# Patient Record
Sex: Female | Born: 1990 | Race: White | Hispanic: No | Marital: Single | Smoking: Never smoker
Health system: Southern US, Community
[De-identification: ages and names within clinical notes are randomized; demographics above are authoritative.]

## PROBLEM LIST (undated history)

## (undated) DIAGNOSIS — K219 Gastro-esophageal reflux disease without esophagitis: Secondary | ICD-10-CM

## (undated) DIAGNOSIS — IMO0002 Reserved for concepts with insufficient information to code with codable children: Secondary | ICD-10-CM

## (undated) HISTORY — DX: Gastro-esophageal reflux disease without esophagitis: K21.9

## (undated) HISTORY — DX: Reserved for concepts with insufficient information to code with codable children: IMO0002

---
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
## 2012-01-29 ENCOUNTER — Emergency Department: Payer: Self-pay | Admitting: Emergency Medicine

## 2012-03-17 ENCOUNTER — Ambulatory Visit: Payer: Self-pay | Admitting: Pediatrics

## 2012-03-21 ENCOUNTER — Emergency Department: Payer: Self-pay | Admitting: Emergency Medicine

## 2012-03-21 LAB — CBC
HGB: 11.3 g/dL — ABNORMAL LOW (ref 12.0–16.0)
Platelet: 249 10*3/uL (ref 150–440)
RBC: 4.76 10*6/uL (ref 3.80–5.20)
RDW: 16.6 % — ABNORMAL HIGH (ref 11.5–14.5)

## 2012-03-25 LAB — WOUND CULTURE

## 2012-03-27 ENCOUNTER — Emergency Department: Payer: Self-pay | Admitting: Emergency Medicine

## 2012-03-29 ENCOUNTER — Emergency Department: Payer: Self-pay | Admitting: *Deleted

## 2012-07-07 ENCOUNTER — Emergency Department: Payer: Self-pay | Admitting: Emergency Medicine

## 2012-10-09 ENCOUNTER — Encounter: Payer: Self-pay | Admitting: General Surgery

## 2012-10-22 ENCOUNTER — Emergency Department: Payer: Self-pay | Admitting: Emergency Medicine

## 2012-10-22 ENCOUNTER — Ambulatory Visit: Payer: Self-pay | Admitting: Gastroenterology

## 2012-10-22 LAB — BASIC METABOLIC PANEL
Anion Gap: 4 — ABNORMAL LOW (ref 7–16)
Chloride: 112 mmol/L — ABNORMAL HIGH (ref 98–107)
Co2: 27 mmol/L (ref 21–32)
Creatinine: 0.61 mg/dL (ref 0.60–1.30)
EGFR (African American): >60
EGFR (Non-African Amer.): >60
Osmolality: 281 (ref 275–301)
Sodium: 143 mmol/L (ref 136–145)

## 2012-10-22 LAB — CBC
MCH: 23 pg — ABNORMAL LOW (ref 26.0–34.0)
MCHC: 31.6 g/dL — ABNORMAL LOW (ref 32.0–36.0)
Platelet: 356 10*3/uL (ref 150–440)
RBC: 5.04 10*6/uL (ref 3.80–5.20)
WBC: 7.9 10*3/uL (ref 3.6–11.0)

## 2012-10-23 ENCOUNTER — Emergency Department: Payer: Self-pay | Admitting: Emergency Medicine

## 2012-10-24 LAB — URINALYSIS, COMPLETE
Glucose,UR: NEGATIVE mg/dL (ref 0–75)
Leukocyte Esterase: NEGATIVE
Nitrite: NEGATIVE
Protein: NEGATIVE

## 2012-10-24 LAB — COMPREHENSIVE METABOLIC PANEL
Alkaline Phosphatase: 133 U/L (ref 50–136)
Calcium, Total: 9.4 mg/dL (ref 8.5–10.1)
Glucose: 85 mg/dL (ref 65–99)
Potassium: 3.8 mmol/L (ref 3.5–5.1)
SGPT (ALT): 19 U/L (ref 12–78)
Sodium: 139 mmol/L (ref 136–145)

## 2012-10-24 LAB — CBC
HCT: 37.4 % (ref 35.0–47.0)
MCH: 22.4 pg — ABNORMAL LOW (ref 26.0–34.0)
MCHC: 30.7 g/dL — ABNORMAL LOW (ref 32.0–36.0)

## 2012-10-24 LAB — LIPASE, BLOOD: Lipase: 216 U/L (ref 73–393)

## 2012-10-27 ENCOUNTER — Ambulatory Visit: Payer: Self-pay | Admitting: Gastroenterology

## 2012-11-19 ENCOUNTER — Emergency Department: Payer: Self-pay | Admitting: Emergency Medicine

## 2012-11-19 LAB — URINALYSIS, COMPLETE
Bilirubin,UR: NEGATIVE
Glucose,UR: NEGATIVE mg/dL (ref 0–75)
Ketone: NEGATIVE
Leukocyte Esterase: NEGATIVE
Ph: 6 (ref 4.5–8.0)
Protein: NEGATIVE
RBC,UR: 2 /HPF (ref 0–5)
Squamous Epithelial: 2
WBC UR: 2 /HPF (ref 0–5)

## 2012-11-19 LAB — CBC
HCT: 34.7 % — ABNORMAL LOW (ref 35.0–47.0)
MCHC: 32.4 g/dL (ref 32.0–36.0)
WBC: 4.3 10*3/uL (ref 3.6–11.0)

## 2012-11-19 LAB — BASIC METABOLIC PANEL
Calcium, Total: 8.9 mg/dL (ref 8.5–10.1)
Chloride: 110 mmol/L — ABNORMAL HIGH (ref 98–107)
EGFR (African American): >60
Osmolality: 278 (ref 275–301)
Potassium: 3.8 mmol/L (ref 3.5–5.1)

## 2012-11-19 LAB — GC/CHLAMYDIA PROBE AMP

## 2012-11-19 LAB — WET PREP, GENITAL

## 2012-12-02 ENCOUNTER — Other Ambulatory Visit: Payer: Self-pay

## 2012-12-02 ENCOUNTER — Encounter: Payer: Self-pay | Admitting: General Surgery

## 2012-12-02 ENCOUNTER — Ambulatory Visit (INDEPENDENT_AMBULATORY_CARE_PROVIDER_SITE_OTHER): Payer: BC Managed Care – PPO | Admitting: General Surgery

## 2012-12-02 VITALS — BP 122/82 | HR 88 | Resp 16 | Ht 68.0 in | Wt 279.0 lb

## 2012-12-02 DIAGNOSIS — N644 Mastodynia: Secondary | ICD-10-CM

## 2012-12-02 NOTE — Progress Notes (Signed)
Patient ID: Shannon Potter, female   DOB: 1991/06/12, 22 y.o.   MRN: [id number]  Chief Complaint  Patient presents with  . Follow-up    breast    HPI Shannon Potter is a 22 y.o. female. Patient here today for 6 month follow up left breast pain.  States the left breast is still tender and sore and that it comes and goes. She does notice the pain more with movement. Family history of breast cancer includes paternal grandmother. HPI  Past Medical History  Diagnosis Date  . Gastroesophageal reflux     No past surgical history on file.  Family History  Problem Relation Age of Onset  . Cholelithiasis Mother   . Cholelithiasis Father     Social History History  Substance Use Topics  . Smoking status: Never Smoker   . Smokeless tobacco: Not on file  . Alcohol Use: No    Allergies  Allergen Reactions  . Clindamycin/Lincomycin Hives    Current Outpatient Prescriptions  Medication Sig Dispense Refill  . DULoxetine (CYMBALTA) 30 MG capsule Take 30 mg by mouth daily.      Marland Kitchen topiramate (TOPAMAX) 50 MG tablet Take 50 mg by mouth daily.      . lansoprazole (PREVACID) 30 MG capsule Take 1 capsule (30 mg total) by mouth daily.  30 capsule  11   No current facility-administered medications for this visit.    Review of Systems Review of Systems  Constitutional: Negative.   Respiratory: Negative.   Cardiovascular: Negative.     Blood pressure 122/82, pulse 88, resp. rate 16, height 5\' 8"  (1.727 m), weight 279 lb (126.554 kg), last menstrual period 11/25/2012.  Physical Exam Physical Exam  Constitutional: She is oriented to person, place, and time. She appears well-developed and well-nourished.  Cardiovascular: Normal rate and regular rhythm.   Pulmonary/Chest: Effort normal and breath sounds normal. Right breast exhibits no inverted nipple, no mass, no nipple discharge, no skin change and no tenderness. Left breast exhibits tenderness. Left breast exhibits no inverted nipple, no  mass, no nipple discharge and no skin change.  Lymphadenopathy:    She has no cervical adenopathy.    She has no axillary adenopathy.  Neurological: She is alert and oriented to person, place, and time.  Skin: Skin is warm and dry.  left breast mild focal tenderness mid upper outer quadrant    Data Reviewed Ultrasound left breast upper outer quadrant is normal  Assessment    Left breast pain, no findings    Plan    Routine yearly exam Continue self breat exam       Ting Cage G 12/02/2012, 10:04 AM

## 2012-12-02 NOTE — Patient Instructions (Addendum)
Continue self breast exams. Call office for any new breast issues or concerns. 

## 2013-03-21 ENCOUNTER — Emergency Department: Payer: Self-pay | Admitting: Emergency Medicine

## 2013-03-21 LAB — CBC WITH DIFFERENTIAL/PLATELET
Eosinophil #: 0.1 10*3/uL (ref 0.0–0.7)
Eosinophil %: 1.1 %
HCT: 36.6 % (ref 35.0–47.0)
HGB: 11.7 g/dL — ABNORMAL LOW (ref 12.0–16.0)
Lymphocyte #: 1.5 10*3/uL (ref 1.0–3.6)
MCHC: 32.1 g/dL (ref 32.0–36.0)
Neutrophil #: 5.7 10*3/uL (ref 1.4–6.5)
RDW: 17.5 % — ABNORMAL HIGH (ref 11.5–14.5)
WBC: 7.9 10*3/uL (ref 3.6–11.0)

## 2013-03-21 LAB — COMPREHENSIVE METABOLIC PANEL
BUN: 5 mg/dL — ABNORMAL LOW (ref 7–18)
Bilirubin,Total: 0.3 mg/dL (ref 0.2–1.0)
Chloride: 109 mmol/L — ABNORMAL HIGH (ref 98–107)
EGFR (African American): >60
EGFR (Non-African Amer.): >60
Glucose: 89 mg/dL (ref 65–99)
Potassium: 3.3 mmol/L — ABNORMAL LOW (ref 3.5–5.1)
SGPT (ALT): 21 U/L (ref 12–78)
Sodium: 140 mmol/L (ref 136–145)

## 2013-03-21 LAB — URINALYSIS, COMPLETE
Bacteria: NONE SEEN
Bilirubin,UR: NEGATIVE
Nitrite: NEGATIVE
Ph: 7 (ref 4.5–8.0)
RBC,UR: 1 /HPF (ref 0–5)
Squamous Epithelial: 1
WBC UR: <1 /HPF (ref 0–5)

## 2013-03-21 LAB — DRUG SCREEN, URINE
Amphetamines, Ur Screen: NEGATIVE (ref ?–1000)
Barbiturates, Ur Screen: NEGATIVE (ref ?–200)
Benzodiazepine, Ur Scrn: NEGATIVE (ref ?–200)
Cannabinoid 50 Ng, Ur ~~LOC~~: NEGATIVE (ref ?–50)
MDMA (Ecstasy)Ur Screen: NEGATIVE (ref ?–500)
Phencyclidine (PCP) Ur S: NEGATIVE (ref ?–25)

## 2013-03-21 LAB — SALICYLATE LEVEL: Salicylates, Serum: <1.7 mg/dL

## 2013-04-17 ENCOUNTER — Ambulatory Visit: Payer: Self-pay | Admitting: Family Medicine

## 2013-06-08 ENCOUNTER — Ambulatory Visit: Payer: Self-pay | Admitting: Neurology

## 2013-06-08 LAB — CBC WITH DIFFERENTIAL/PLATELET
Basophil #: 0.1 10*3/uL (ref 0.0–0.1)
Eosinophil #: 0.2 10*3/uL (ref 0.0–0.7)
Eosinophil %: 2.7 %
HCT: 34.8 % — ABNORMAL LOW (ref 35.0–47.0)
Lymphocyte #: 1.8 10*3/uL (ref 1.0–3.6)
Lymphocyte %: 27.9 %
MCH: 22.6 pg — ABNORMAL LOW (ref 26.0–34.0)
MCHC: 32.1 g/dL (ref 32.0–36.0)
MCV: 71 fL — ABNORMAL LOW (ref 80–100)
Monocyte #: 0.6 x10 3/mm (ref 0.2–0.9)
Monocyte %: 9 %
Neutrophil #: 3.7 10*3/uL (ref 1.4–6.5)
Neutrophil %: 59.3 %
RDW: 16.1 % — ABNORMAL HIGH (ref 11.5–14.5)
WBC: 6.3 10*3/uL (ref 3.6–11.0)

## 2013-09-20 ENCOUNTER — Ambulatory Visit: Payer: Self-pay | Admitting: Orthopedic Surgery

## 2013-12-08 ENCOUNTER — Other Ambulatory Visit: Payer: BC Managed Care – PPO

## 2013-12-08 ENCOUNTER — Encounter: Payer: Self-pay | Admitting: General Surgery

## 2013-12-08 ENCOUNTER — Ambulatory Visit (INDEPENDENT_AMBULATORY_CARE_PROVIDER_SITE_OTHER): Payer: BC Managed Care – PPO | Admitting: General Surgery

## 2013-12-08 VITALS — BP 120/66 | HR 88 | Resp 16 | Ht 68.0 in | Wt 259.0 lb

## 2013-12-08 DIAGNOSIS — N63 Unspecified lump in unspecified breast: Secondary | ICD-10-CM

## 2013-12-08 DIAGNOSIS — N644 Mastodynia: Secondary | ICD-10-CM

## 2013-12-08 NOTE — Patient Instructions (Addendum)
Continue self breast exams. Call office for any new breast issues or concerns. Follow up in 3 months.

## 2013-12-08 NOTE — Progress Notes (Signed)
Patient ID: Shannon Potter, female   DOB: [birth year], 23 y.o.   MRN: [id number]  Chief Complaint  Patient presents with  . Follow-up    breast    HPI Shannon Potter is a 23 y.o. female here today for her left breast check. Patient states is doing well. No new breast complaints. States the left breast is still tender and sore and that it comes and goes with her monthly cycle. She does admit to a 20 pound intentional weight loss since January.    HPI  Past Medical History  Diagnosis Date  . Gastroesophageal reflux   . Bulging disc     History reviewed. No pertinent past surgical history.  Family History  Problem Relation Age of Onset  . Cholelithiasis Mother   . Cholelithiasis Father     Social History History  Substance Use Topics  . Smoking status: Never Smoker   . Smokeless tobacco: Not on file  . Alcohol Use: No    Allergies  Allergen Reactions  . Clindamycin/Lincomycin Hives    Current Outpatient Prescriptions  Medication Sig Dispense Refill  . DULoxetine (CYMBALTA) 30 MG capsule Take 30 mg by mouth daily.      Marland Kitchen topiramate (TOPAMAX) 50 MG tablet Take 50 mg by mouth daily.      . traMADol (ULTRAM) 50 MG tablet Take by mouth every 6 (six) hours as needed.       No current facility-administered medications for this visit.    Review of Systems Review of Systems  Constitutional: Negative.   Respiratory: Negative.   Cardiovascular: Negative.     Blood pressure 120/66, pulse 88, resp. rate 16, height 5\' 8"  (1.727 m), weight 259 lb (117.482 kg), last menstrual period 11/24/2013.  Physical Exam Physical Exam  Constitutional: She is oriented to person, place, and time. She appears well-developed and well-nourished.  Eyes: Conjunctivae are normal.  Neck: Neck supple.  Pulmonary/Chest: Right breast exhibits no inverted nipple, no mass, no nipple discharge, no skin change and no tenderness. Left breast exhibits mass. Left breast exhibits no inverted nipple, no  nipple discharge, no skin change and no tenderness.  1 cm subcutaneous soft palpable mass left breast 9-10 o'clock   Lymphadenopathy:    She has no cervical adenopathy.    She has no axillary adenopathy.  Neurological: She is alert and oriented to person, place, and time.  Skin: Skin is warm and dry.    Data Reviewed US of the left breast mass was done. There is an ill defined hypoechoic area 2.41 by 1.87 by 1.14 cm in size. Some hyperechoic areas seen within. No associated shadowing or through transmission. This finding has benign features.  Assessment    Stable physical exam.  A 1 cm subcutaneous soft palpable mass left breast at 9-10 o'clock. This is same location where she had a mass 2 yrs ago. Likely a lipoma.     Plan    Follow up in 3 months.       Archimedes Harold G Kaelen Brennan 12/09/2013, 8:22 AM

## 2013-12-09 ENCOUNTER — Encounter: Payer: Self-pay | Admitting: General Surgery

## 2014-02-21 ENCOUNTER — Ambulatory Visit (INDEPENDENT_AMBULATORY_CARE_PROVIDER_SITE_OTHER): Payer: BC Managed Care – PPO | Admitting: General Surgery

## 2014-02-21 ENCOUNTER — Other Ambulatory Visit: Payer: BC Managed Care – PPO

## 2014-02-21 ENCOUNTER — Encounter: Payer: Self-pay | Admitting: General Surgery

## 2014-02-21 VITALS — BP 134/88 | HR 92 | Resp 14 | Ht 68.0 in | Wt 256.0 lb

## 2014-02-21 DIAGNOSIS — N644 Mastodynia: Secondary | ICD-10-CM

## 2014-02-21 NOTE — Patient Instructions (Signed)
Continue self breast exams. Call office for any new breast issues or concerns. 

## 2014-02-21 NOTE — Progress Notes (Signed)
Patient ID: Shannon Potter, female   DOB: 08-22-1990, 23 y.o.   MRN: [id number]  Chief Complaint  Patient presents with  . Follow-up    breast check    HPI Shannon Potter is a 23 y.o. female.  who presents for a breast follow up. Patient does perform regular self breast checks. She states the left breast has some discomfort but it comes and goes. Her total weight loss has been 30 pounds.  HPI  Past Medical History  Diagnosis Date  . Gastroesophageal reflux   . Bulging disc     History reviewed. No pertinent past surgical history.  Family History  Problem Relation Age of Onset  . Cholelithiasis Mother   . Cholelithiasis Father     Social History History  Substance Use Topics  . Smoking status: Never Smoker   . Smokeless tobacco: Not on file  . Alcohol Use: No    Allergies  Allergen Reactions  . Clindamycin/Lincomycin Hives    Current Outpatient Prescriptions  Medication Sig Dispense Refill  . DULoxetine (CYMBALTA) 30 MG capsule Take 30 mg by mouth daily.      . traMADol (ULTRAM) 50 MG tablet Take by mouth every 6 (six) hours as needed.       No current facility-administered medications for this visit.    Review of Systems Review of Systems  Constitutional: Negative.   Respiratory: Negative.   Cardiovascular: Negative.     Blood pressure 134/88, pulse 92, resp. rate 14, height 5\' 8"  (1.727 m), weight 256 lb (116.121 kg), last menstrual period 02/08/2014.  Physical Exam Physical Exam  Constitutional: She is oriented to person, place, and time. She appears well-developed and well-nourished.  Eyes: Conjunctivae are normal. No scleral icterus.  Neck: Neck supple.  Pulmonary/Chest: Right breast exhibits no inverted nipple, no mass, no nipple discharge, no skin change and no tenderness. Left breast exhibits mass. Left breast exhibits no inverted nipple, no nipple discharge, no skin change and no tenderness.  Mild irregularity in the upper inner right breast 1-2  o'clock location. The same mass at 9 o'clock left is 1.5 cm size unchanged from before.  Lymphadenopathy:    She has no cervical adenopathy.    She has no axillary adenopathy.  Neurological: She is alert and oriented to person, place, and time.  Skin: Skin is warm and dry.    Data Reviewed Office notes and previous ultrasound reviewed. US of right breast showed no findings over the 1-2 ocl location. US of left breast over palpable mass again showed an ill defined suggestion of a mass not seen in all views. Assessment    Mild irregularity in the upper inner right breast 1-2 o'clock location. The same mass at 9 o'clock is 1.5 cm size unchanged from before. These are benign findings and can be followed.    Plan    Follow up in one year.       SANKAR,SEEPLAPUTHUR G 02/23/2014, 8:01 AM

## 2014-02-23 ENCOUNTER — Encounter: Payer: Self-pay | Admitting: General Surgery

## 2014-03-07 ENCOUNTER — Emergency Department: Payer: Self-pay | Admitting: Emergency Medicine

## 2014-03-31 ENCOUNTER — Ambulatory Visit: Payer: Self-pay | Admitting: Orthopedic Surgery

## 2014-04-17 IMAGING — CT CT HEAD WITHOUT CONTRAST
1 series · 16 of 29 positions shown, 20 images · non-contrast
Comparison: none

REASON FOR EXAM: hit head 6 days ago, memory and personality changes,
blurred vision    flex 5
COMMENTS:   LMP: Three weeks ago

PROCEDURE:     CT  - CT HEAD WITHOUT CONTRAST  - January 29, 2012  [DATE]
RESULT:     Comparison:  None
TECHNIQUE: Multiple axial images from the foramen magnum to the vertex were
obtained without IV contrast.

[Series 2: soft tissue · axial · 0.43mm/px · z∈[-134,-4]mm · 16 of 29 slices shown, 20 images]
[im 2/29  brain]
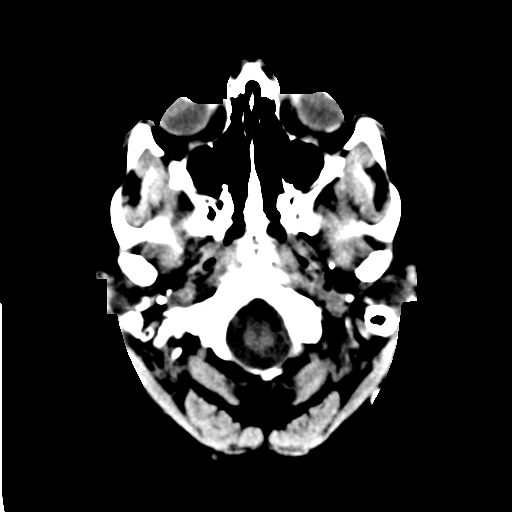
[im 2/29  bone]
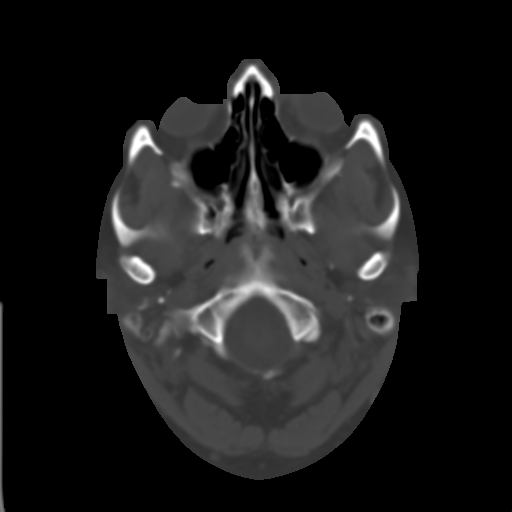
[im 4/29  brain]
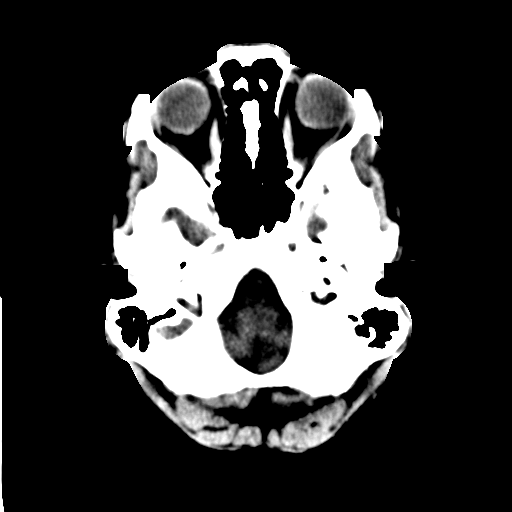
[im 6/29  brain]
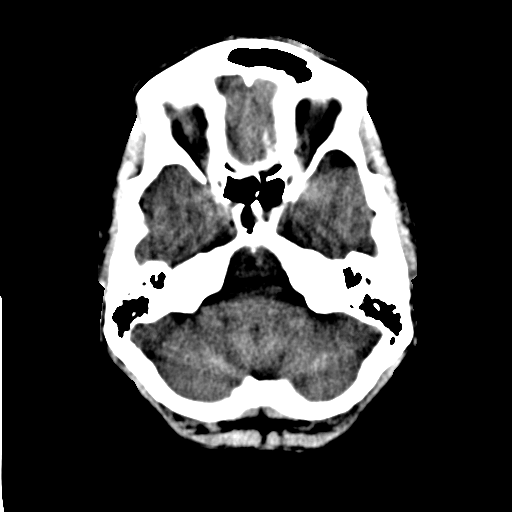
[im 7/29  brain]
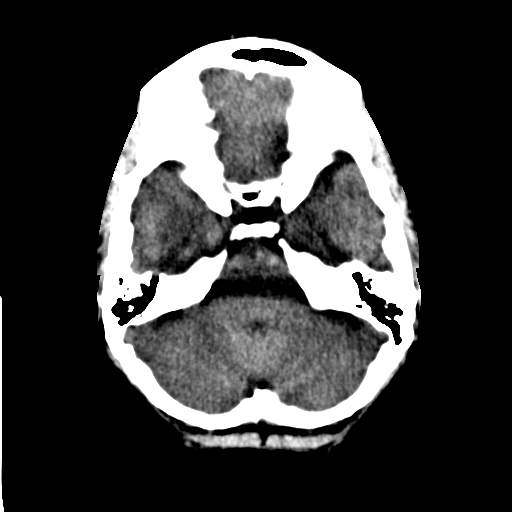
[im 9/29  brain]
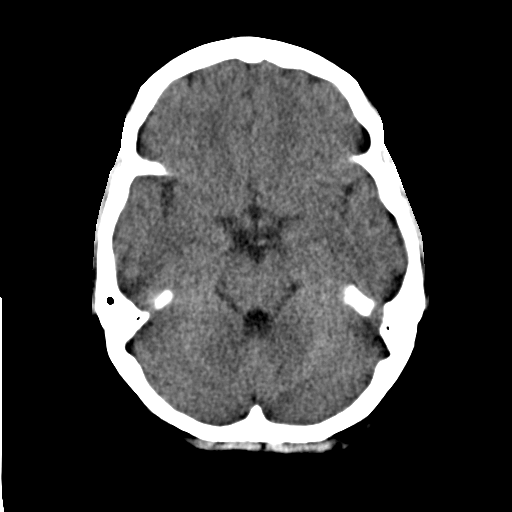
[im 9/29  bone]
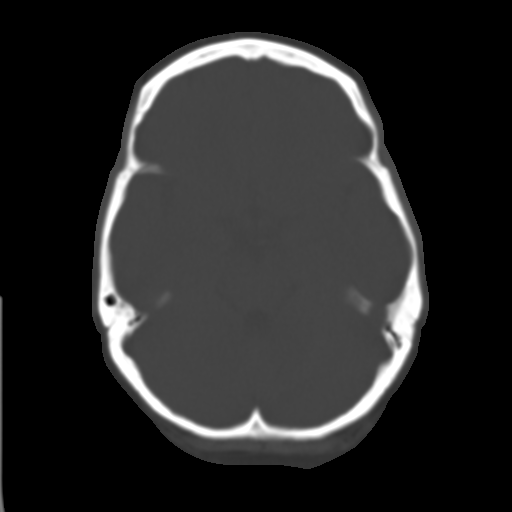
[im 11/29  brain]
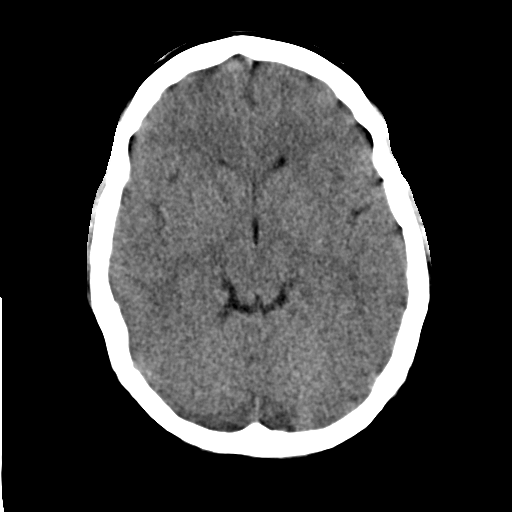
[im 12/29  brain]
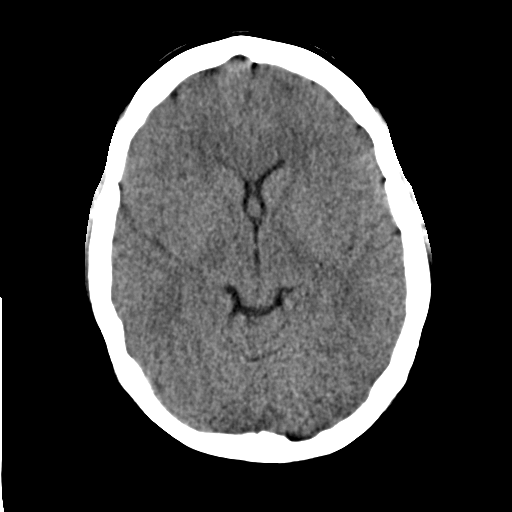
[im 14/29  brain]
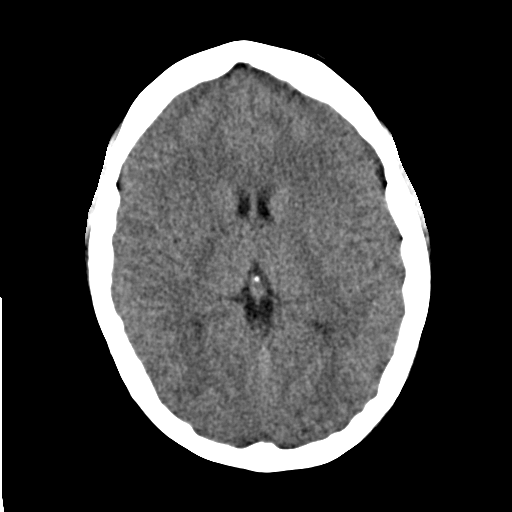
[im 16/29  brain]
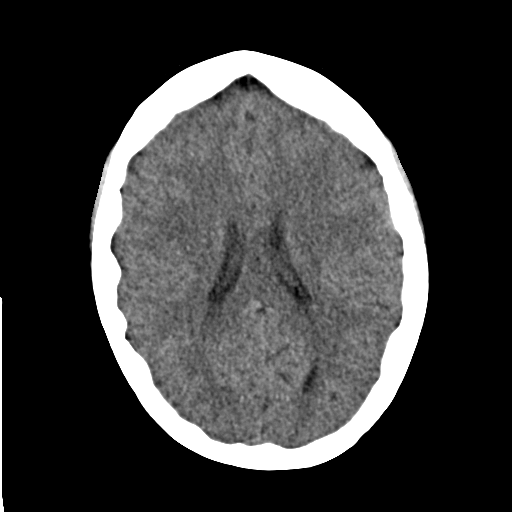
[im 16/29  bone]
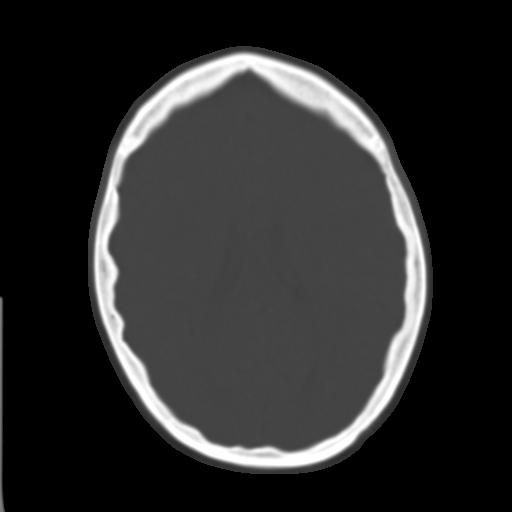
[im 18/29  brain]
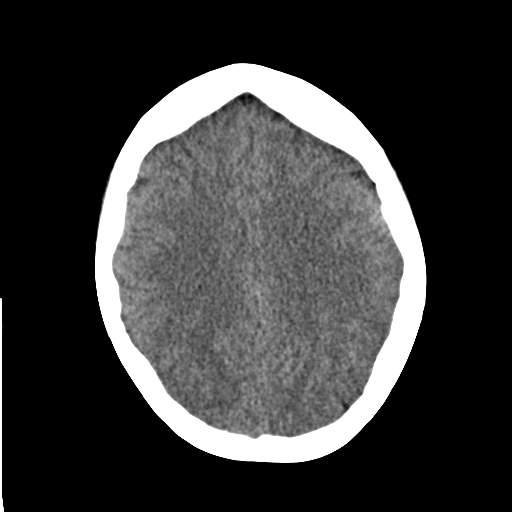
[im 19/29  brain]
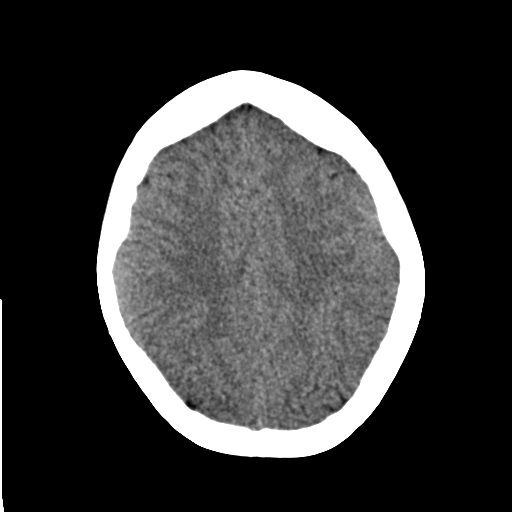
[im 21/29  brain]
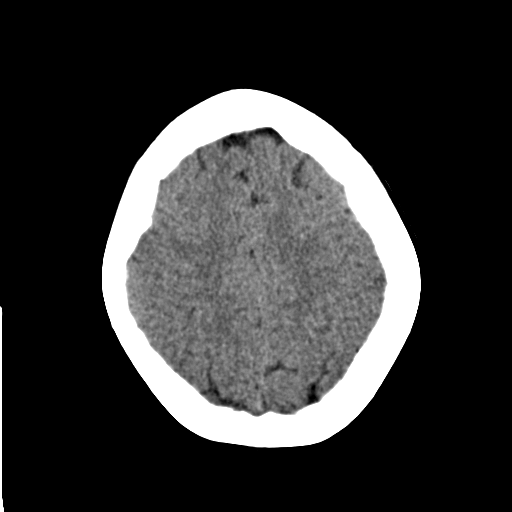
[im 23/29  brain]
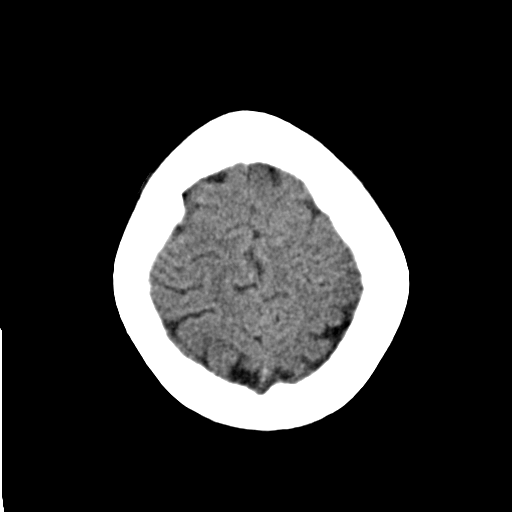
[im 23/29  bone]
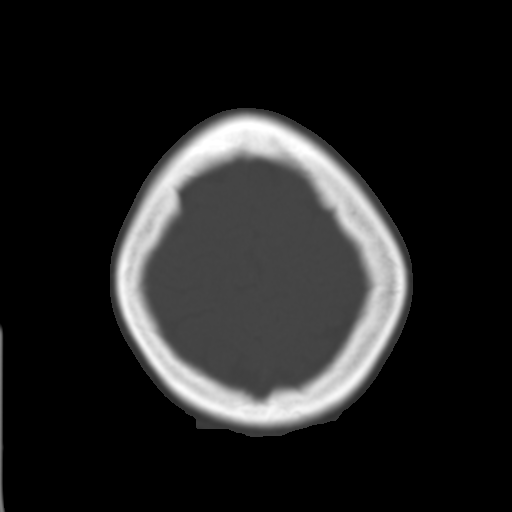
[im 24/29  brain]
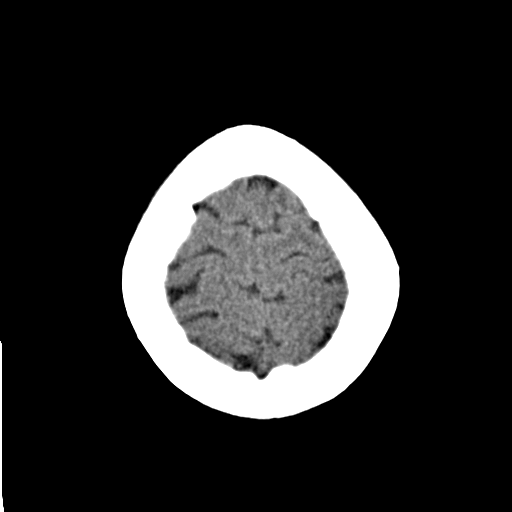
[im 26/29  brain]
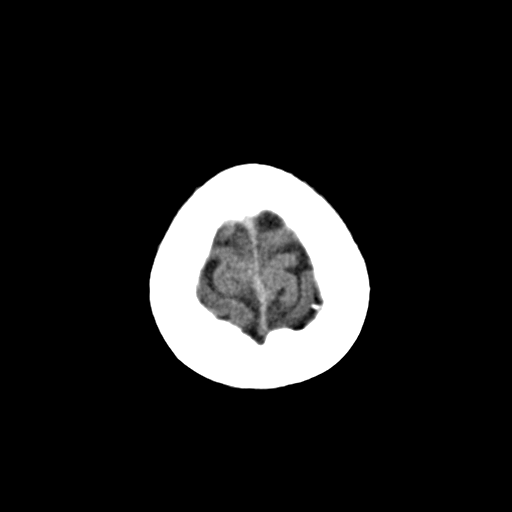
[im 28/29  brain]
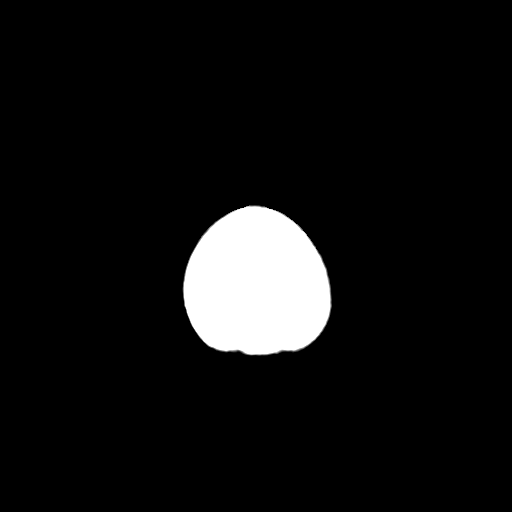

[16 of 29 positions shown; findings below may reference images not displayed]

FINDINGS: There is no evidence for mass effect, midline shift, or extra-axial fluid
collections. There is no evidence for space-occupying lesion, intracranial
hemorrhage, or cortical-based area of infarction.

The osseous structures are unremarkable.
IMPRESSION: No acute intracranial process.

## 2014-05-15 ENCOUNTER — Encounter: Payer: Self-pay | Admitting: General Surgery

## 2014-05-22 ENCOUNTER — Emergency Department: Payer: Self-pay | Admitting: Emergency Medicine

## 2015-01-10 ENCOUNTER — Encounter: Payer: Self-pay | Admitting: *Deleted

## 2015-02-21 ENCOUNTER — Ambulatory Visit: Payer: Self-pay | Admitting: General Surgery

## 2015-03-06 ENCOUNTER — Ambulatory Visit: Payer: Self-pay | Admitting: General Surgery

## 2015-03-31 ENCOUNTER — Emergency Department
Admission: EM | Admit: 2015-03-31 | Discharge: 2015-03-31 | Disposition: A | Discharge status: Home / Self Care | Payer: BLUE CROSS/BLUE SHIELD | Attending: Emergency Medicine | Admitting: Emergency Medicine

## 2015-03-31 ENCOUNTER — Encounter: Payer: Self-pay | Admitting: Adult Health

## 2015-03-31 DIAGNOSIS — L0201 Cutaneous abscess of face: Secondary | ICD-10-CM | POA: Diagnosis present

## 2015-03-31 DIAGNOSIS — Z79899 Other long term (current) drug therapy: Secondary | ICD-10-CM | POA: Diagnosis not present

## 2015-03-31 DIAGNOSIS — L03211 Cellulitis of face: Secondary | ICD-10-CM | POA: Diagnosis not present

## 2015-03-31 MED ORDER — SULFAMETHOXAZOLE-TRIMETHOPRIM 800-160 MG PO TABS: 1.0000 | ORAL_TABLET | Freq: Two times a day (BID) | ORAL | Status: AC

## 2015-03-31 MED ORDER — SULFAMETHOXAZOLE-TRIMETHOPRIM 800-160 MG PO TABS
1.0000 | ORAL_TABLET | Freq: Once | ORAL | Status: AC
  Administered 2015-03-31: 1 via ORAL
  Filled 2015-03-31: qty 1

## 2015-03-31 NOTE — Discharge Instructions (Signed)
Cellulitis Cellulitis is an infection of the skin and the tissue under the skin. The infected area is usually red and tender. This happens most often in the arms and lower legs. HOME CARE   Take your antibiotic medicine as told. Finish the medicine even if you start to feel better.  Keep the infected arm or leg raised (elevated).  Put a warm cloth on the area up to 4 times per day.  Only take medicines as told by your doctor.  Keep all doctor visits as told. GET HELP IF:  You see red streaks on the skin coming from the infected area.  Your red area gets bigger or turns a dark color.  Your bone or joint under the infected area is painful after the skin heals.  Your infection comes back in the same area or different area.  You have a puffy (swollen) bump in the infected area.  You have new symptoms.  You have a fever. GET HELP RIGHT AWAY IF:   You feel very sleepy.  You throw up (vomit) or have watery poop (diarrhea).  You feel sick and have muscle aches and pains. MAKE SURE YOU:   Understand these instructions.  Will watch your condition.  Will get help right away if you are not doing well or get worse. Document Released: 12/17/2007 Document Revised: 11/14/2013 Document Reviewed: 09/15/2011 [facility] Patient Information 2015 [place], [place]. This information is not intended to replace advice given to you by your health care provider. Make sure you discuss any questions you have with your health care provider.  Facial Infection You have an infection of your face. This requires special attention to help prevent serious problems. Infections in facial wounds can cause poor healing and scars. They can also spread to deeper tissues, especially around the eye. Wound and dental infections can lead to sinusitis, infection of the eye socket, and even meningitis. Permanent damage to the skin, eye, and nervous system may result if facial infections are not treated properly. With  severe infections, hospital care for IV antibiotic injections may be needed if they don't respond to oral antibiotics. Antibiotics must be taken for the full course to insure the infection is eliminated. If the infection came from a bad tooth, it may have to be extracted when the infection is under control. Warm compresses may be applied to reduce skin irritation and remove drainage. You might need a tetanus shot now if:  You cannot remember when your last tetanus shot was.  You have never had a tetanus shot.  The object that caused your wound was dirty. If you need a tetanus shot, and you decide not to get one, there is a rare chance of getting tetanus. Sickness from tetanus can be serious. If you got a tetanus shot, your arm may swell, get red and warm to the touch at the shot site. This is common and not a problem. SEEK IMMEDIATE MEDICAL CARE IF:   You have increased swelling, redness, or trouble breathing.  You have a severe headache, dizziness, nausea, or vomiting.  You develop problems with your eyesight.  You have a fever. Document Released: 08/07/2004 Document Revised: 09/22/2011 Document Reviewed: 06/30/2005 [facility] Patient Information 2015 [place], [place]. This information is not intended to replace advice given to you by your health care provider. Make sure you discuss any questions you have with your health care provider.  Take the antibiotic as directed.  Apply warm compresses to promote healing. Follow-up with [facility] as needed.

## 2015-03-31 NOTE — ED Notes (Addendum)
Presents with swelling to right side of face at jaw line-pt reports it began yesterday as a hard knot on the side of her face, this AM the knot was much bigger and hard and she touched her face and the indurated area drained blood and purulent drainage-relieving the pressure on the side of her face. Area is soft and tender to touch, drainage has stopped.  Hx of MRSA 3 years ago

## 2015-03-31 NOTE — ED Provider Notes (Signed)
[facility] Emergency Department Provider Note ____________________________________________  Time seen: 2220  I have reviewed the triage vital signs and the nursing notes.  HISTORY  Chief Complaint  Abscess  HPI Shannon Potter is a 24 y.o. female reports to the ED with some swelling to the right side of face and jaw that began yesterday. She noted a small not to the right side of the jaw yesterday that began to increase in size. The area became indurated and after she picked a small scab off it drained a small amount of blood and pus. She denies any insect bite, allergic contact, or acne. She denies any fevers, chills, sweats. She does report a history of MRSA some 3 years prior.  Past Medical History  Diagnosis Date  . Gastroesophageal reflux   . Bulging disc     Patient Active Problem List   Diagnosis Date Noted  . Breast pain 12/02/2012  . Obesity (BMI 30-39.9) 02/19/2011  . Gastroesophageal reflux     History reviewed. No pertinent past surgical history.  Current Outpatient Rx  Name  Route  Sig  Dispense  Refill  . DULoxetine (CYMBALTA) 30 MG capsule   Oral   Take 30 mg by mouth daily.         Marland Kitchen sulfamethoxazole-trimethoprim (BACTRIM DS,SEPTRA DS) 800-160 MG per tablet   Oral   Take 1 tablet by mouth 2 (two) times daily.   20 tablet   0   . traMADol (ULTRAM) 50 MG tablet   Oral   Take by mouth every 6 (six) hours as needed.          Allergies Clindamycin/lincomycin  Family History  Problem Relation Age of Onset  . Cholelithiasis Mother   . Cholelithiasis Father    Social History Social History  Substance Use Topics  . Smoking status: Never Smoker   . Smokeless tobacco: None  . Alcohol Use: No   Review of Systems  Constitutional: Negative for fever. Eyes: Negative for visual changes. ENT: Negative for sore throat. Cardiovascular: Negative for chest pain. Respiratory: Negative for shortness of breath. Gastrointestinal:  Negative for abdominal pain, vomiting and diarrhea. Genitourinary: Negative for dysuria. Musculoskeletal: Negative for back pain. Skin: Negative for rash. Tender nodule to the right lower jaw. Neurological: Negative for headaches, focal weakness or numbness. ____________________________________________  PHYSICAL EXAM:  VITAL SIGNS: ED Triage Vitals  Enc Vitals Group     BP 03/31/15 1903 138/77 mmHg     Pulse Rate 03/31/15 1903 97     Resp 03/31/15 1903 18     Temp 03/31/15 1903 98.4 F (36.9 C)     Temp Source 03/31/15 1903 Oral     SpO2 03/31/15 1903 97 %     Weight 03/31/15 2248 250 lb (113.399 kg)     Height 03/31/15 2248  (1.727 m)     Head Cir --      Peak Flow --      Pain Score 03/31/15 1903 4     Pain Loc --      Pain Edu? --      Excl. in GC? --    Constitutional: Alert and oriented. Well appearing and in no distress. Eyes: Conjunctivae are normal. PERRL. Normal extraocular movements. ENT   Head: Normocephalic and atraumatic.   Nose: No congestion/rhinorrhea.   Mouth/Throat: Mucous membranes are moist.   Neck: Supple. No thyromegaly. Hematological/Lymphatic/Immunological: No cervical lymphadenopathy. Cardiovascular: Normal rate, regular rhythm.  Respiratory: Normal respiratory effort. No wheezes/rales/rhonchi.  Gastrointestinal: Soft and nontender. No distention. Musculoskeletal: Nontender with normal range of motion in all extremities.  Neurologic:  Normal gait without ataxia. Normal speech and language. No gross focal neurologic deficits are appreciated. Skin:  Skin is warm, dry and intact. No rash noted. Right lower jaw with single, firm nodule under the skin with overlying ulceration. Consistent with local cellulitis  Psychiatric: Mood and affect are normal. Patient exhibits appropriate insight and judgment. ____________________________________________  PROCEDURES  Bactrim DS 1 PO ____________________________________________  INITIAL  IMPRESSION / ASSESSMENT AND PLAN / ED COURSE  Local cellulitis to the face. We'll treat with Bactrim DS. Patient is to apply warm compresses to promote healing. Follow with primary care provider for ongoing symptoms. ____________________________________________  FINAL CLINICAL IMPRESSION(S) / ED DIAGNOSES  Final diagnoses:  Facial cellulitis     Lissa Hoard, PA-C 03/31/15 2332  Phineas Semen, MD 04/01/15 0010

## 2015-04-12 ENCOUNTER — Encounter: Payer: Self-pay | Admitting: *Deleted

## 2015-08-26 IMAGING — RF LUMBAR PUNCTURE FLUORO GUIDE
3 series · 6 of 6 positions shown · non-contrast
Comparison: none

CLINICAL DATA: Pseudotumor cerebri

[Series 1: lat · 0.17mm/px · 2 of 2 slices shown]
[im 1/2]
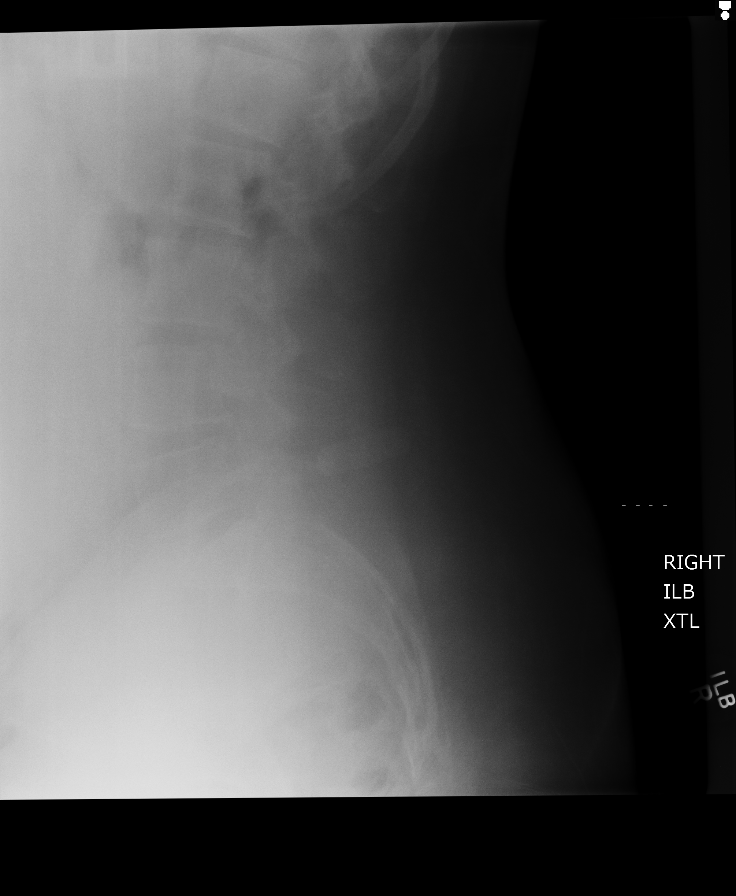
[im 2/2]
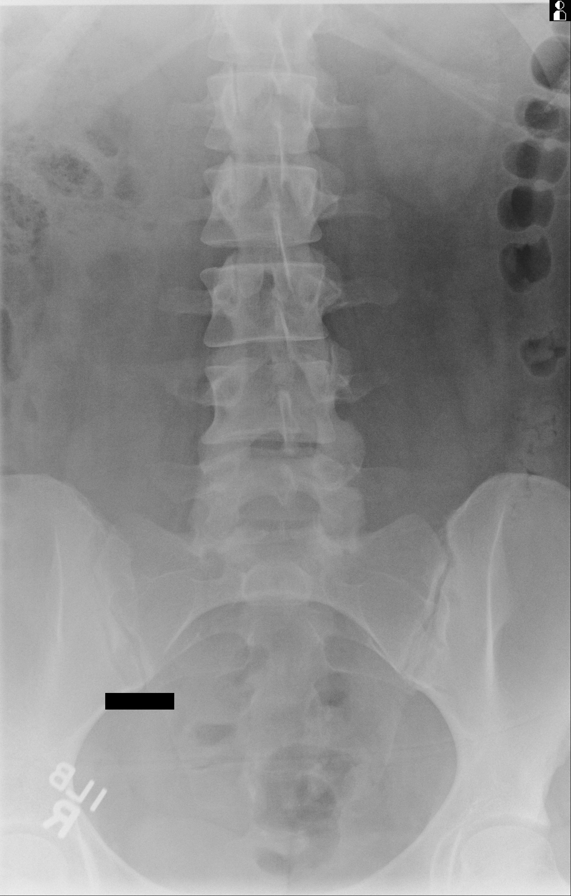

[Series 3: fluoro_iodine 2fps_bw · 0.17mm/px · 2 of 2 frames shown (1 of 2)]
[frame 1/2]
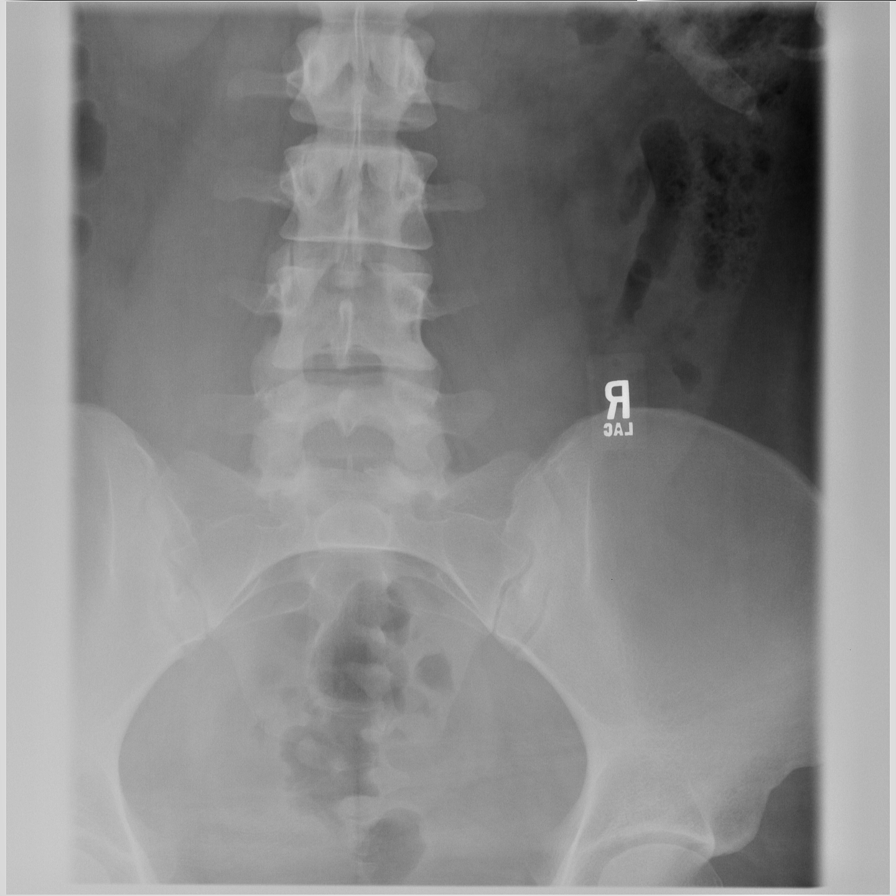
[frame 2/2]
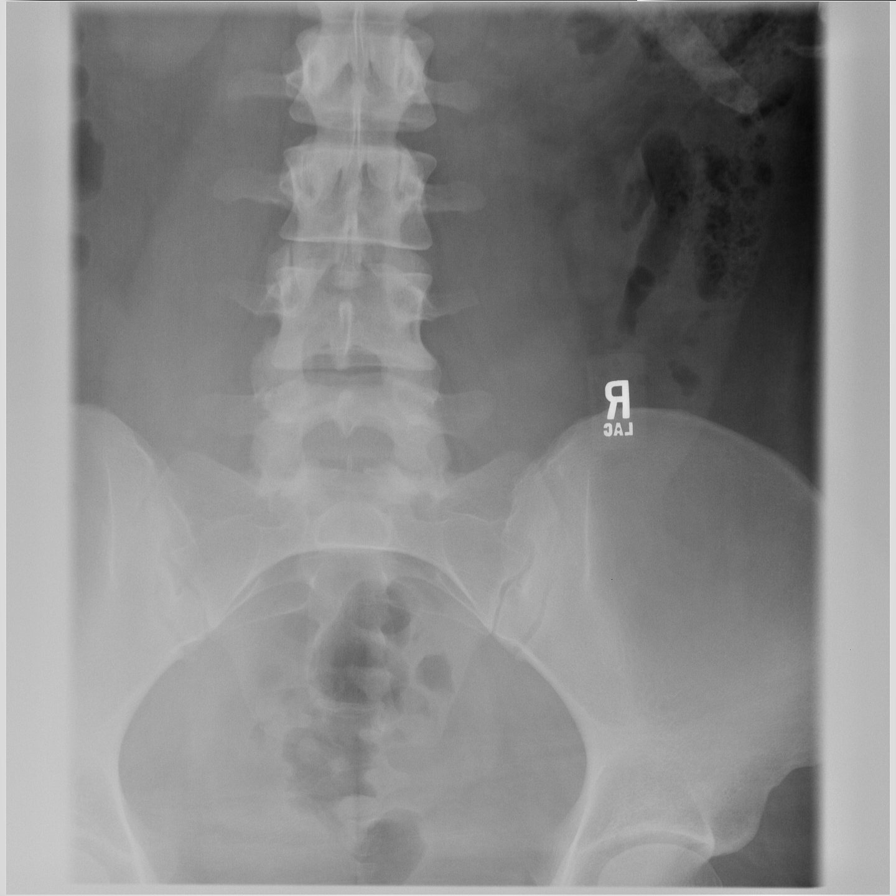

[Series 4: fluoro_iodine 2fps_bw · 0.17mm/px · 2 of 2 frames shown (2 of 2)]
[frame 1/2]
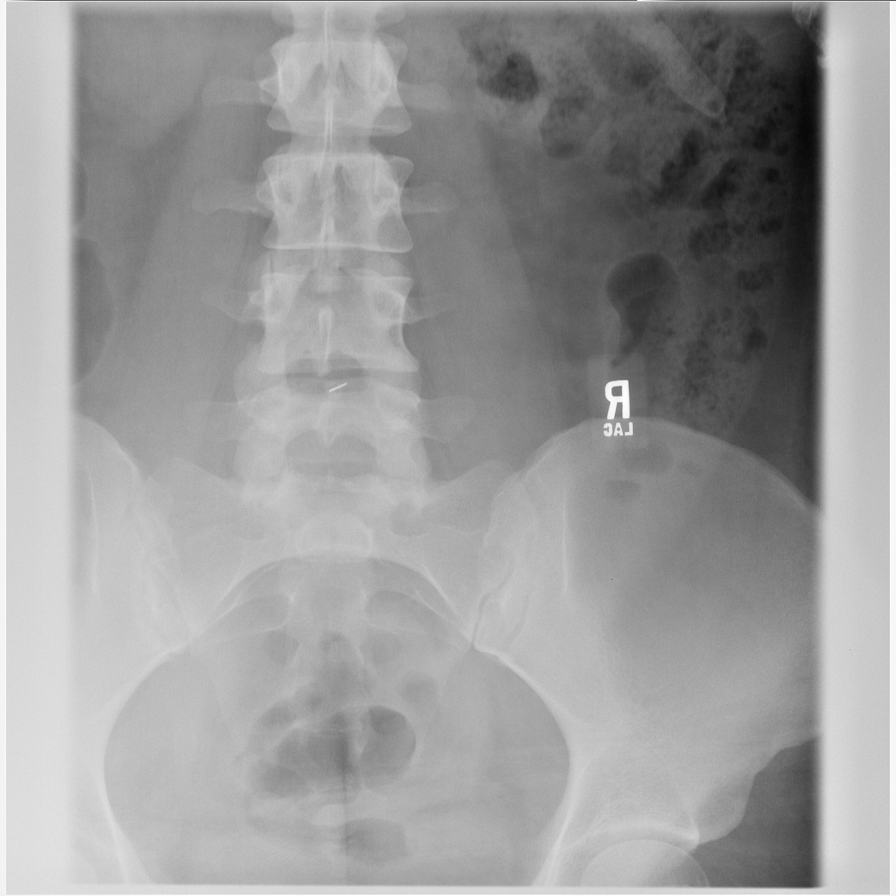
[frame 2/2]
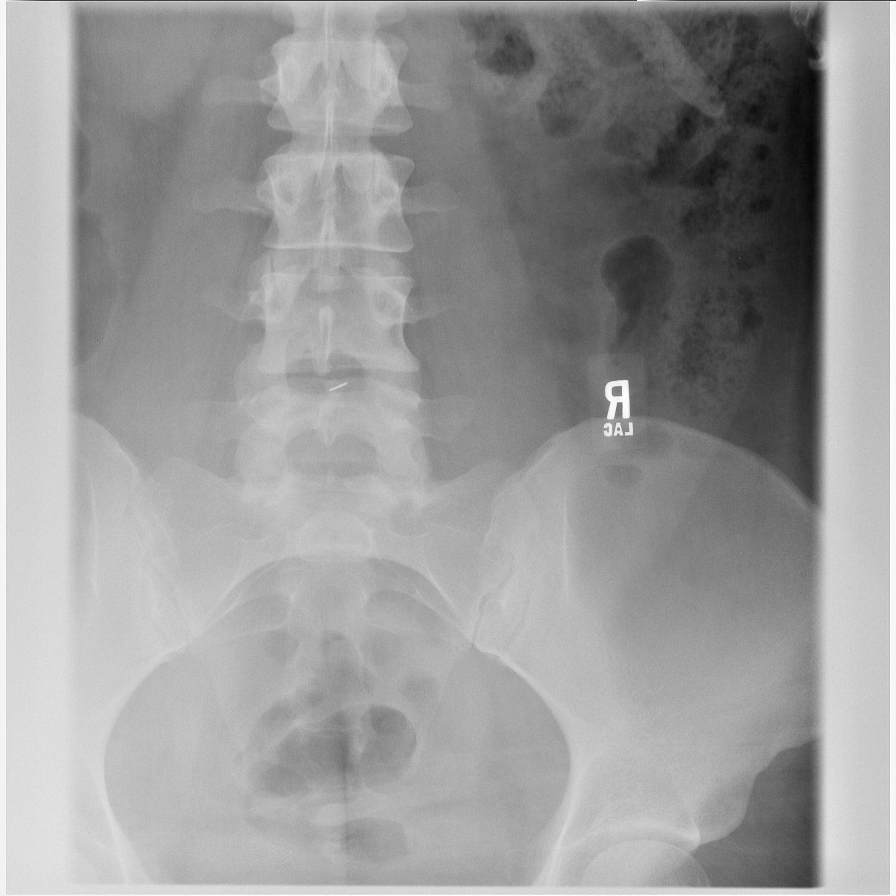

[6 of 6 positions shown; findings below may reference images not displayed]

EXAM:
DIAGNOSTIC LUMBAR PUNCTURE UNDER FLUOROSCOPIC GUIDANCE

FLUOROSCOPY TIME:  2 min

PROCEDURE:
Informed consent was obtained from the patient prior to the
procedure, including potential complications of headache, allergy,
and pain. With the patient prone, the lower back was prepped with
Betadine. 1% Lidocaine was used for local anesthesia. Lumbar
puncture was performed at the L4-5 level using a 5 inch 22 gauge
needle with return of clear CSF with an opening pressure of <10 mm
water. 10ml of CSF were obtained for laboratory studies. The
patient's closing pressure was less than 10 mm water. The patient
tolerated the procedure well and there were no apparent
complications.
IMPRESSION: Non complicated fluoroscopic guided lumbar puncture described above.
